# Patient Record
Sex: Male | Born: 1965 | Race: White | Hispanic: No | Marital: Married | Smoking: Former smoker
Health system: Southern US, Community
[De-identification: ages and names within clinical notes are randomized; demographics above are authoritative.]

---

## 1999-03-19 ENCOUNTER — Encounter: Payer: Self-pay | Admitting: Orthopedic Surgery

## 1999-03-19 ENCOUNTER — Observation Stay (HOSPITAL_COMMUNITY): Admission: RE | Admit: 1999-03-19 | Discharge: 1999-03-20 | Payer: Self-pay | Admitting: Orthopedic Surgery

## 2009-02-25 ENCOUNTER — Emergency Department (HOSPITAL_COMMUNITY): Admission: EM | Admit: 2009-02-25 | Discharge: 2009-02-25 | Payer: Self-pay | Admitting: Emergency Medicine

## 2009-04-02 ENCOUNTER — Emergency Department (HOSPITAL_COMMUNITY): Admission: EM | Admit: 2009-04-02 | Discharge: 2009-04-02 | Payer: Self-pay | Admitting: Emergency Medicine

## 2009-04-02 ENCOUNTER — Encounter: Payer: Self-pay | Admitting: Internal Medicine

## 2009-04-26 DIAGNOSIS — F411 Generalized anxiety disorder: Secondary | ICD-10-CM | POA: Insufficient documentation

## 2009-04-26 DIAGNOSIS — R002 Palpitations: Secondary | ICD-10-CM | POA: Insufficient documentation

## 2009-04-26 DIAGNOSIS — R079 Chest pain, unspecified: Secondary | ICD-10-CM | POA: Insufficient documentation

## 2009-04-27 ENCOUNTER — Ambulatory Visit: Payer: Self-pay | Admitting: Internal Medicine

## 2009-04-27 DIAGNOSIS — I4949 Other premature depolarization: Secondary | ICD-10-CM | POA: Insufficient documentation

## 2009-05-03 ENCOUNTER — Ambulatory Visit: Payer: Self-pay

## 2009-05-03 ENCOUNTER — Encounter: Payer: Self-pay | Admitting: Internal Medicine

## 2009-05-07 ENCOUNTER — Encounter: Payer: Self-pay | Admitting: Cardiology

## 2009-06-01 ENCOUNTER — Ambulatory Visit: Payer: Self-pay | Admitting: Internal Medicine

## 2009-06-01 DIAGNOSIS — F17200 Nicotine dependence, unspecified, uncomplicated: Secondary | ICD-10-CM | POA: Insufficient documentation

## 2009-10-01 ENCOUNTER — Telehealth (INDEPENDENT_AMBULATORY_CARE_PROVIDER_SITE_OTHER): Payer: Self-pay | Admitting: *Deleted

## 2010-12-30 IMAGING — CR DG CHEST 2V
2 series · 2 of 2 positions shown · non-contrast
Comparison: None.

CLINICAL DATA: Chest pressure.  Some shortness of breath.

CHEST - 2 VIEW

[w chest pa]
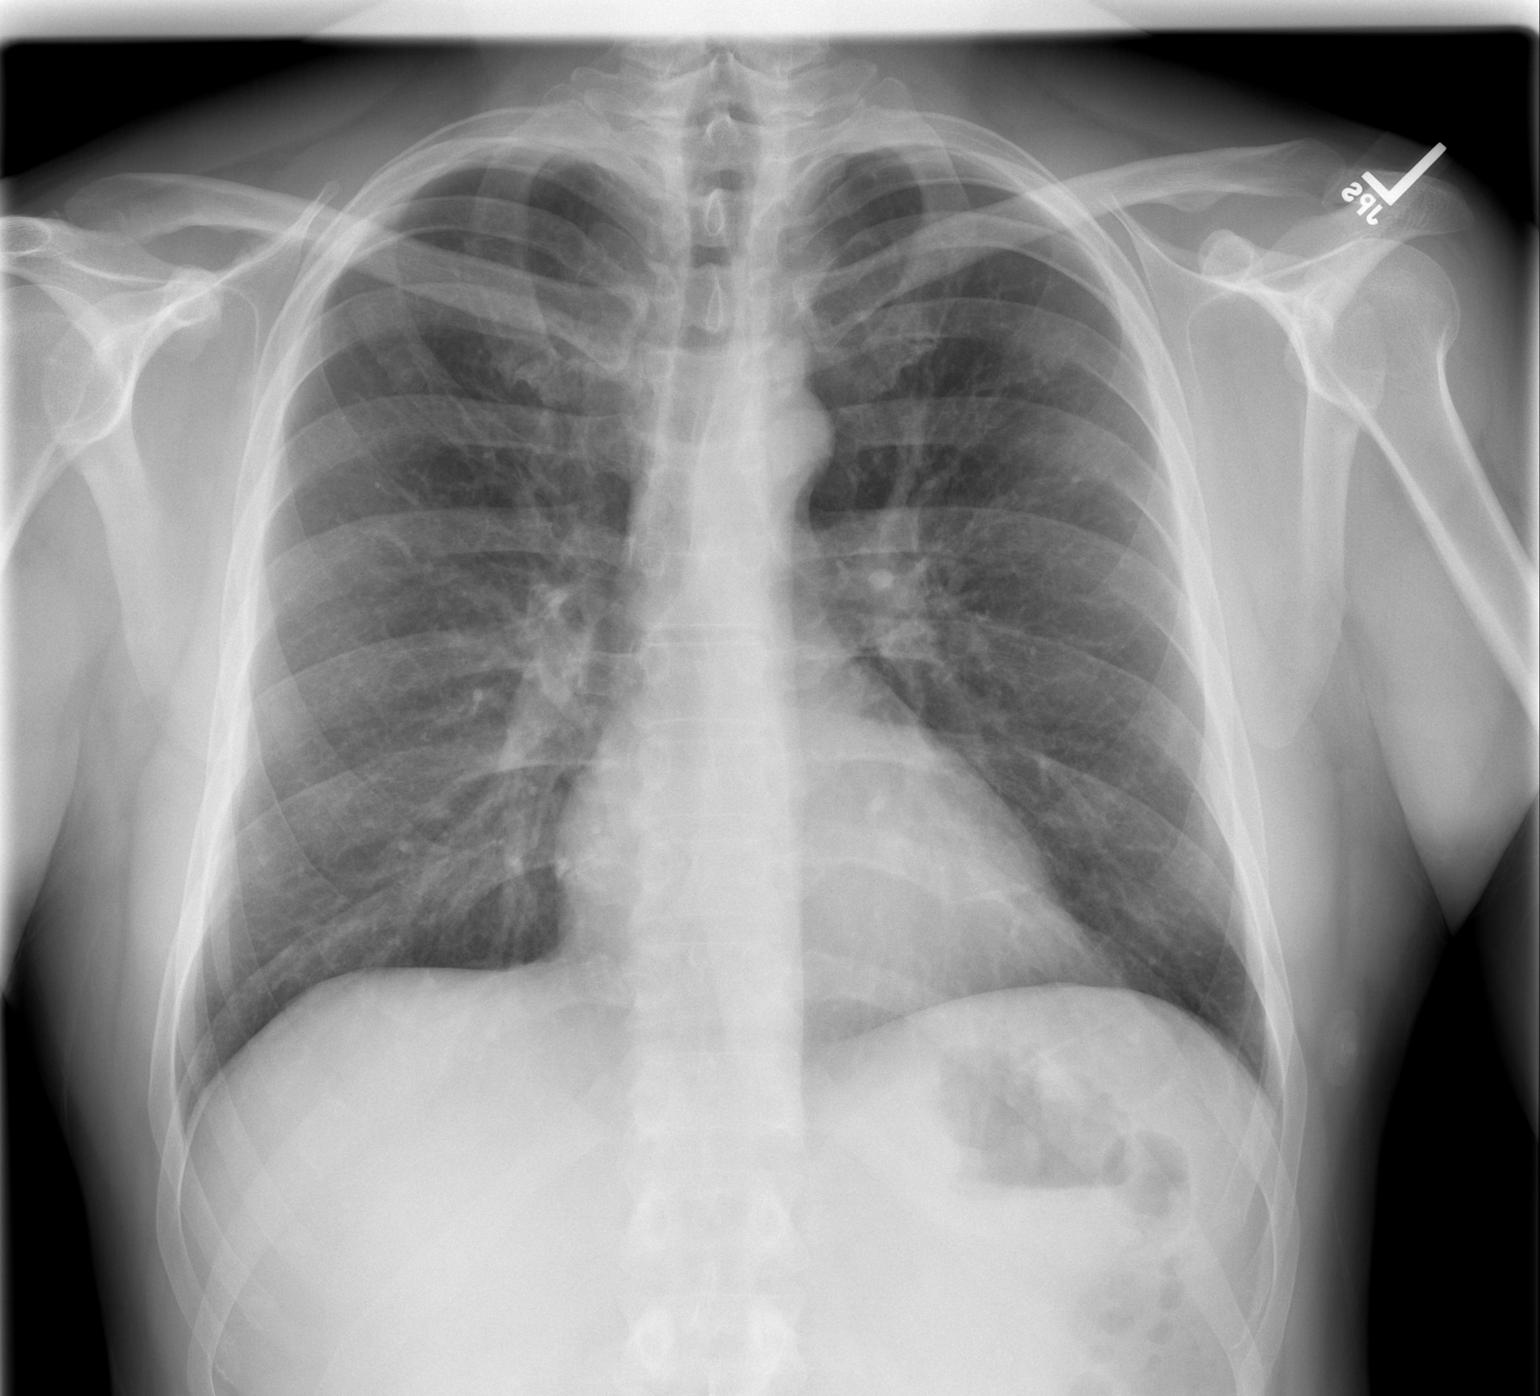

[w chest lat]
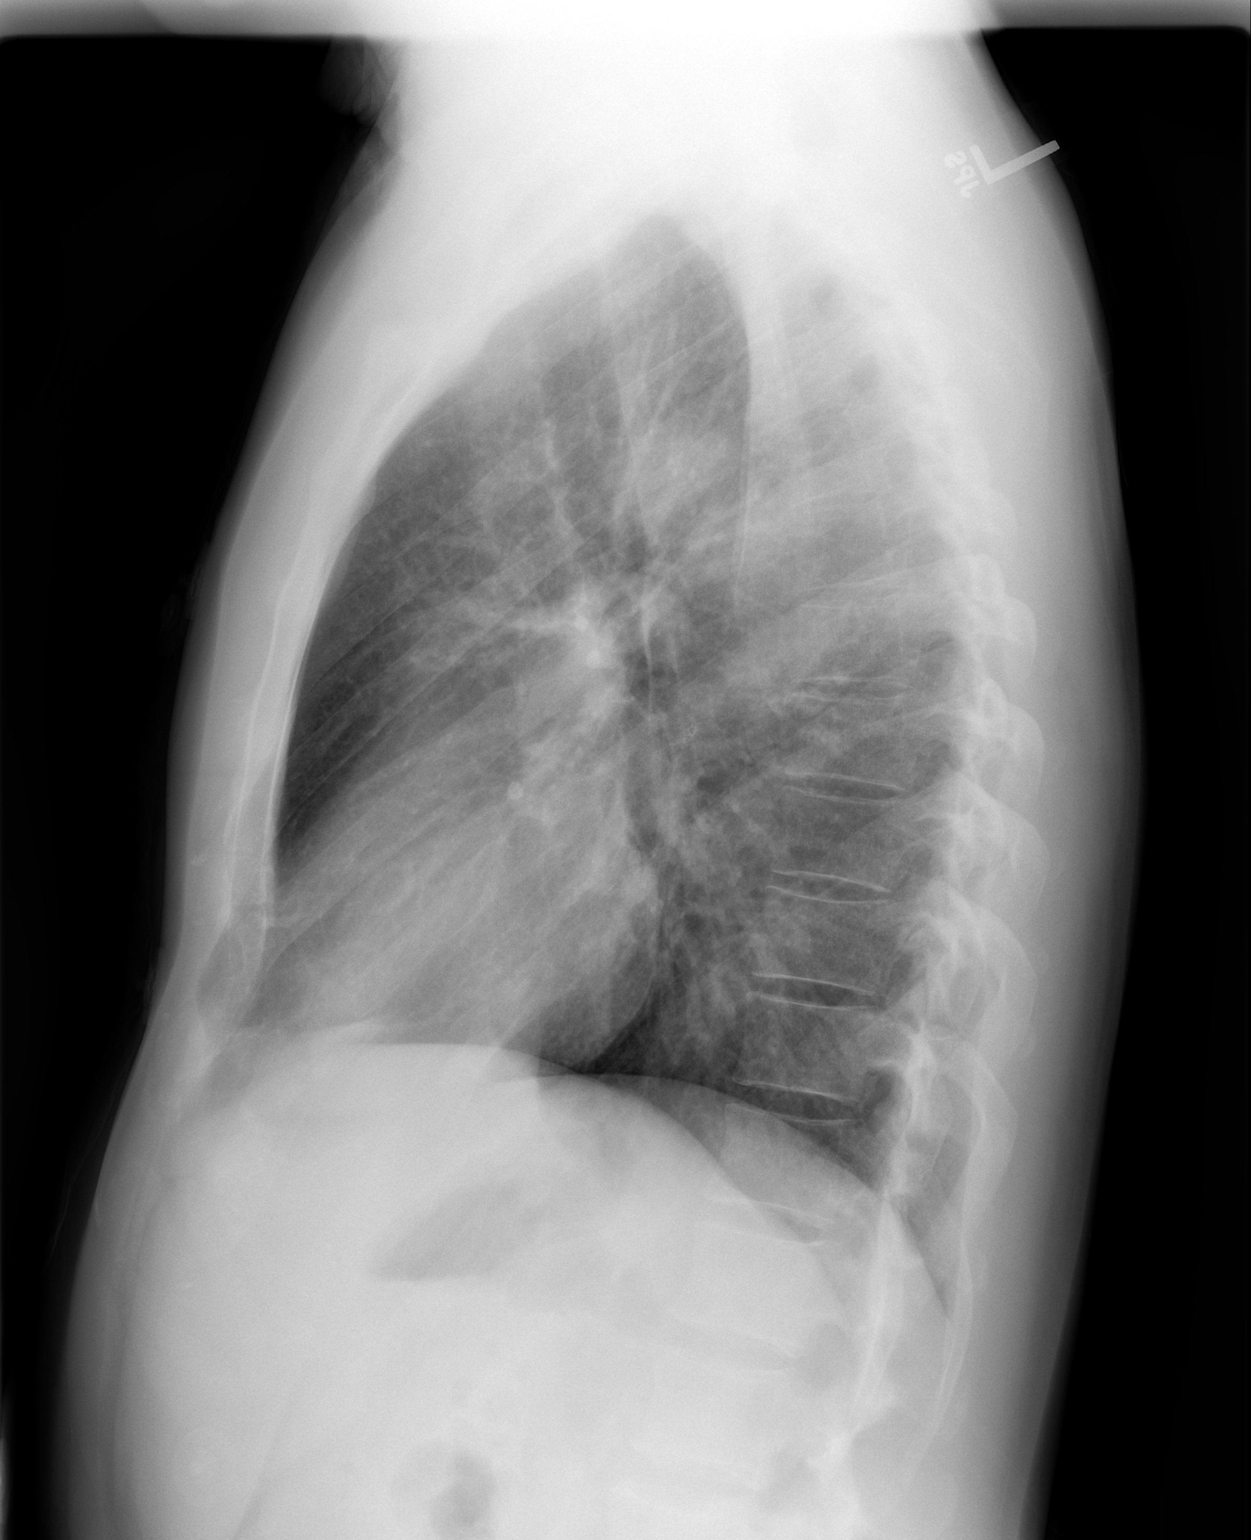

[2 of 2 positions shown; findings below may reference images not displayed]

FINDINGS: The lungs are clear without focal consolidation, edema,
effusion or pneumothorax.  Cardiopericardial silhouette is within
normal limits for size.  Imaged bony structures of the thorax are
intact.
IMPRESSION: No acute cardiopulmonary process.

## 2011-01-20 LAB — POCT I-STAT, CHEM 8
BUN: 14 mg/dL (ref 6–23)
Calcium, Ion: 1.16 mmol/L (ref 1.12–1.32)
Chloride: 106 mEq/L (ref 96–112)
Creatinine, Ser: 1 mg/dL (ref 0.4–1.5)
Glucose, Bld: 96 mg/dL (ref 70–99)
HCT: 48 % (ref 39.0–52.0)
Hemoglobin: 16.3 g/dL (ref 13.0–17.0)
Potassium: 4.3 mEq/L (ref 3.5–5.1)
Sodium: 141 mEq/L (ref 135–145)
TCO2: 28 mmol/L (ref 0–100)

## 2011-01-20 LAB — POCT CARDIAC MARKERS
CKMB, poc: <1 ng/mL — ABNORMAL LOW (ref 1.0–8.0)
Myoglobin, poc: 34.7 ng/mL (ref 12–200)
Troponin i, poc: <0.05 ng/mL (ref 0.00–0.09)

## 2012-05-04 ENCOUNTER — Telehealth: Payer: Self-pay

## 2013-09-01 NOTE — Telephone Encounter (Signed)
error 

## 2021-04-24 ENCOUNTER — Emergency Department (HOSPITAL_BASED_OUTPATIENT_CLINIC_OR_DEPARTMENT_OTHER): Payer: Commercial Managed Care - PPO

## 2021-04-24 ENCOUNTER — Encounter (HOSPITAL_BASED_OUTPATIENT_CLINIC_OR_DEPARTMENT_OTHER): Payer: Self-pay

## 2021-04-24 ENCOUNTER — Other Ambulatory Visit: Payer: Self-pay

## 2021-04-24 ENCOUNTER — Emergency Department (HOSPITAL_BASED_OUTPATIENT_CLINIC_OR_DEPARTMENT_OTHER)
Admission: EM | Admit: 2021-04-24 | Discharge: 2021-04-24 | Disposition: A | Discharge status: Home / Self Care | Payer: Commercial Managed Care - PPO | Attending: Emergency Medicine | Admitting: Emergency Medicine

## 2021-04-24 DIAGNOSIS — R55 Syncope and collapse: Secondary | ICD-10-CM | POA: Diagnosis not present

## 2021-04-24 DIAGNOSIS — R110 Nausea: Secondary | ICD-10-CM | POA: Diagnosis not present

## 2021-04-24 DIAGNOSIS — Z87891 Personal history of nicotine dependence: Secondary | ICD-10-CM | POA: Insufficient documentation

## 2021-04-24 DIAGNOSIS — R000 Tachycardia, unspecified: Secondary | ICD-10-CM | POA: Diagnosis not present

## 2021-04-24 DIAGNOSIS — E860 Dehydration: Secondary | ICD-10-CM | POA: Diagnosis not present

## 2021-04-24 DIAGNOSIS — T675XXA Heat exhaustion, unspecified, initial encounter: Secondary | ICD-10-CM

## 2021-04-24 LAB — COMPREHENSIVE METABOLIC PANEL
ALT: 25 U/L (ref 0–44)
AST: 15 U/L (ref 15–41)
Albumin: 4.7 g/dL (ref 3.5–5.0)
Alkaline Phosphatase: 50 U/L (ref 38–126)
Anion gap: 11 (ref 5–15)
BUN: 19 mg/dL (ref 6–20)
CO2: 22 mmol/L (ref 22–32)
Calcium: 9.9 mg/dL (ref 8.9–10.3)
Chloride: 105 mmol/L (ref 98–111)
Creatinine, Ser: 1.32 mg/dL — ABNORMAL HIGH (ref 0.61–1.24)
GFR, Estimated: >60 mL/min (ref >60)
Glucose, Bld: 106 mg/dL — ABNORMAL HIGH (ref 70–99)
Potassium: 3.6 mmol/L (ref 3.5–5.1)
Sodium: 138 mmol/L (ref 135–145)
Total Bilirubin: 0.8 mg/dL (ref 0.3–1.2)
Total Protein: 8.2 g/dL — ABNORMAL HIGH (ref 6.5–8.1)

## 2021-04-24 LAB — CBC WITH DIFFERENTIAL/PLATELET
Abs Immature Granulocytes: 0.03 10*3/uL (ref 0.00–0.07)
Basophils Absolute: 0 10*3/uL (ref 0.0–0.1)
Basophils Relative: 1 %
Eosinophils Absolute: 0 10*3/uL (ref 0.0–0.5)
Eosinophils Relative: 0 %
HCT: 43.1 % (ref 39.0–52.0)
Hemoglobin: 15 g/dL (ref 13.0–17.0)
Immature Granulocytes: 0 %
Lymphocytes Relative: 19 %
Lymphs Abs: 1.6 10*3/uL (ref 0.7–4.0)
MCH: 33.4 pg (ref 26.0–34.0)
MCHC: 34.8 g/dL (ref 30.0–36.0)
MCV: 96 fL (ref 80.0–100.0)
Monocytes Absolute: 1 10*3/uL (ref 0.1–1.0)
Monocytes Relative: 12 %
Neutro Abs: 5.8 10*3/uL (ref 1.7–7.7)
Neutrophils Relative %: 68 %
Platelets: 251 10*3/uL (ref 150–400)
RBC: 4.49 MIL/uL (ref 4.22–5.81)
RDW: 14.4 % (ref 11.5–15.5)
WBC: 8.5 10*3/uL (ref 4.0–10.5)
nRBC: 0 % (ref 0.0–0.2)

## 2021-04-24 LAB — URINALYSIS, ROUTINE W REFLEX MICROSCOPIC
Bilirubin Urine: NEGATIVE
Glucose, UA: NEGATIVE mg/dL
Hgb urine dipstick: NEGATIVE
Ketones, ur: NEGATIVE mg/dL
Leukocytes,Ua: NEGATIVE
Nitrite: NEGATIVE
Protein, ur: NEGATIVE mg/dL
Specific Gravity, Urine: >1.03 — ABNORMAL HIGH (ref 1.005–1.030)
pH: 5 (ref 5.0–8.0)

## 2021-04-24 MED ORDER — LACTATED RINGERS IV BOLUS
1000.0000 mL | Freq: Once | INTRAVENOUS | Status: AC
  Administered 2021-04-24: 1000 mL via INTRAVENOUS

## 2021-04-24 NOTE — ED Provider Notes (Signed)
[facility] EMERGENCY DEPARTMENT Provider Note   CSN: [id number] Arrival date & time: 04/24/21  1512     History Chief Complaint  Patient presents with   Dizziness    Isaac Harris is a 55 y.o. male.  The history is provided by the patient.  Dizziness Isaac Harris is a 55 y.o. male who presents to the Emergency Department complaining of dizziness. 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 By noon today he felt hot, dizzy with significant diaphoresis and was severely fatigued. He felt like he was going to pass out. He states that he felt like he got overheated working in the community. He had a similar but more mild episode yesterday that occurred at the end of the day. He also reports a headache that started this afternoon. Headaches are very unusual for him. His coworker brought him to the emergency department and he sat and air conditioning for about 30 minutes. He is starting to feel improved on ED arrival but continues to feel unwell. He denies any chest pain. He did have nausea and couldn't eat. He did not eat breakfast this morning. He sometimes skips breakfast. He has no known medical problems and takes no medications.      History reviewed. No pertinent past medical history.  Patient Active Problem List   Diagnosis Date Noted   TOBACCO ABUSE 06/01/2009   UNSPECIFIED PREMATURE BEATS 04/27/2009   ANXIETY 04/26/2009   PALPITATIONS 04/26/2009   CHEST PAIN 04/26/2009    History reviewed. No pertinent surgical history.     No family history on file.  Social History   Tobacco Use   Smoking status: Former    Pack years: 0.00    Types: Cigarettes   Smokeless tobacco: Current  Substance Use Topics   Alcohol use: Yes    Comment: daily   Drug use: Never    Home Medications Prior to Admission medications   Not on File    Allergies    Patient has no known allergies.  Review of Systems   Review of Systems   Neurological:  Positive for dizziness.  All other systems reviewed and are negative.  Physical Exam Updated Vital Signs BP 127/83 (BP Location: Right Arm)   Pulse (!) 107   Temp 98 F (36.7 C) (Oral)   Resp 15   Ht 5\' 7"  (1.702 m)   Wt 83.9 kg   SpO2 98%   BMI 28.98 kg/m   Physical Exam Vitals and nursing note reviewed.  Constitutional:      Appearance: He is well-developed.  HENT:     Head: Normocephalic and atraumatic.  Eyes:     Extraocular Movements: Extraocular movements intact.     Pupils: Pupils are equal, round, and reactive to light.  Cardiovascular:     Rate and Rhythm: Normal rate and regular rhythm.     Heart sounds: No murmur heard. Pulmonary:     Effort: Pulmonary effort is normal. No respiratory distress.     Breath sounds: Normal breath sounds.  Abdominal:     Palpations: Abdomen is soft.     Tenderness: There is no abdominal tenderness. There is no guarding or rebound.  Musculoskeletal:        General: No swelling or tenderness.  Skin:    General: Skin is warm and dry.  Neurological:     Mental Status: He is alert and oriented to person, place, and time.  Comments: No asymmetry of facial movements. 5/5 strength in all four extremities.    Psychiatric:        Behavior: Behavior normal.    ED Results / Procedures / Treatments   Labs (all labs ordered are listed, but only abnormal results are displayed) Labs Reviewed  COMPREHENSIVE METABOLIC PANEL - Abnormal; Notable for the following components:      Result Value   Glucose, Bld 106 (*)    Creatinine, Ser 1.32 (*)    Total Protein 8.2 (*)    All other components within normal limits  URINALYSIS, ROUTINE W REFLEX MICROSCOPIC - Abnormal; Notable for the following components:   Specific Gravity, Urine >1.030 (*)    All other components within normal limits  CBC WITH DIFFERENTIAL/PLATELET    EKG EKG Interpretation  Date/Time:  Wednesday April 24 2021 16:04:39 EDT Ventricular Rate:   101 PR Interval:  162 QRS Duration: 78 QT Interval:  344 QTC Calculation: 446 R Axis:   73 Text Interpretation: Sinus tachycardia Confirmed by Tilden Fossa [phone]) on 04/24/2021 4:10:25 PM  Radiology CT Head Wo Contrast  Result Date: 04/24/2021 CLINICAL DATA:  Headache.  Rule out intracranial hemorrhage EXAM: CT HEAD WITHOUT CONTRAST TECHNIQUE: Contiguous axial images were obtained from the base of the skull through the vertex without intravenous contrast. COMPARISON:  None. FINDINGS: Brain: No evidence of acute infarction, hemorrhage, hydrocephalus, extra-axial collection or mass lesion/mass effect. Vascular: Negative for hyperdense vessel Skull: Negative Sinuses/Orbits: Paranasal sinuses clear.  Negative orbit Other: None IMPRESSION: Negative CT head Electronically Signed   By: Marlan Palau M.D.   On: 04/24/2021 16:18    Procedures Procedures   Medications Ordered in ED Medications  lactated ringers bolus 1,000 mL (1,000 mLs Intravenous New Bag/Given 04/24/21 1604)    ED Course  I have reviewed the triage vital signs and the nursing notes.  Pertinent labs & imaging results that were available during my care of the patient were reviewed by me and considered in my medical decision making (see chart for details).    MDM Rules/Calculators/A&P                         patient here for evaluation of dizziness and near syncopal episode in context of working in the heat today and not eating breakfast. He is tachycardic on ED presentation with no focal neurologic deficits. He does have a new onset headache, no history of similars in the past. Imaging is negative for acute abnormality. No significant electrolyte abnormality on labs. No significant anemia. He was treated with IV fluids in the department and continued to feel improved during his ED stay. Discussed with patient home care for heat illness as well as dehydration. Discussed outpatient follow-up and return precautions.  Final  Clinical Impression(s) / ED Diagnoses Final diagnoses:  Dehydration  Heat exhaustion, initial encounter    Rx / DC Orders ED Discharge Orders     None        Tilden Fossa, MD 04/24/21 1635

## 2021-04-24 NOTE — ED Triage Notes (Signed)
Pt c/o intermittent dizziness started yesterday while working outside-states worse since 12pm today with HA-NAD-to triage in w/c

## 2021-07-17 ENCOUNTER — Other Ambulatory Visit: Payer: Self-pay

## 2021-07-17 ENCOUNTER — Ambulatory Visit (INDEPENDENT_AMBULATORY_CARE_PROVIDER_SITE_OTHER): Payer: Commercial Managed Care - PPO | Admitting: Otolaryngology

## 2021-07-17 DIAGNOSIS — J310 Chronic rhinitis: Secondary | ICD-10-CM

## 2021-07-17 MED ORDER — TRIAMCINOLONE ACETONIDE 55 MCG/ACT NA AERO: 2.0000 | INHALATION_SPRAY | Freq: Every day | NASAL | 12 refills | Status: AC

## 2021-07-17 NOTE — Progress Notes (Addendum)
HPI: Isaac Harris is a 55 y.o. male who presents for evaluation of complaints of pressure in his ears.  He complains of fullness in the ears as if he has an ear infection or fluid in the ears.  He has not noticed that much hearing problems although when he "pops" his ears he feels like he hears better.  He was tried on Flonase by his medical doctor but this did not seem to help that much and he has not used this regularly. He apparently had COVID back in August.  He does work around a lot of loud noise and machinery.  No past medical history on file. No past surgical history on file. Social History   Socioeconomic History   Marital status: Married    Spouse name: Not on file   Number of children: Not on file   Years of education: Not on file   Highest education level: Not on file  Occupational History   Not on file  Tobacco Use   Smoking status: Former    Types: Cigarettes   Smokeless tobacco: Current  Substance and Sexual Activity   Alcohol use: Yes    Comment: daily   Drug use: Never   Sexual activity: Not on file  Other Topics Concern   Not on file  Social History Narrative   Not on file   Social Determinants of Health   Financial Resource Strain: Not on file  Food Insecurity: Not on file  Transportation Needs: Not on file  Physical Activity: Not on file  Stress: Not on file  Social Connections: Not on file   No family history on file. No Known Allergies Prior to Admission medications   Not on File     Positive ROS: Otherwise negative  All other systems have been reviewed and were otherwise negative with the exception of those mentioned in the HPI and as above.  Physical Exam: Constitutional: Alert, well-appearing, no acute distress Ears: External ears without lesions or tenderness. Ear canals are clear bilaterally.  No significant wax buildup no inflammatory changes.  TMs are clear bilaterally with no middle ear space abnormality noted and no middle ear  fluid noted.  Hearing screening with the 512 1024 tuning fork revealed perhaps a mild hearing loss but AC was greater than BC bilaterally. Nasal: External nose without lesions. Septum with minimal deformity and mild rhinitis.  After decongesting the nose both nasal passages were clear as was in the middle meatus..  Oral: Lips and gums without lesions. Tongue and palate mucosa without lesions. Posterior oropharynx clear. Neck: No palpable adenopathy or masses.  No TMJ discomfort noted. Respiratory: Breathing comfortably  Skin: No facial/neck lesions or rash noted.  Audiologic testing in the office today demonstrated essentially normal hearing except for moderate noise notch at 4000-8000 frequency in the left ear only at a minimal notch on the right side.  SRT's were 10 dB bilaterally and he had type A tympanograms bilaterally.  Procedures  Assessment: Mild rhinitis with perhaps some eustachian tube dysfunction but no middle ear effusion noted and normal hearing evaluation on audiogram.  Plan: Suggested using Nasacort 2 sprays each nostril regularly and sent this into his [facility] in [place]. Reassured him of normal hearing evaluation and no middle ear space or ear canal abnormality noted on microscopic exam.  Narda Bonds, MD

## 2021-07-23 ENCOUNTER — Encounter (INDEPENDENT_AMBULATORY_CARE_PROVIDER_SITE_OTHER): Payer: Self-pay

## 2023-05-12 ENCOUNTER — Emergency Department (HOSPITAL_BASED_OUTPATIENT_CLINIC_OR_DEPARTMENT_OTHER): Payer: Commercial Managed Care - PPO

## 2023-05-12 ENCOUNTER — Encounter (HOSPITAL_BASED_OUTPATIENT_CLINIC_OR_DEPARTMENT_OTHER): Payer: Self-pay | Admitting: Emergency Medicine

## 2023-05-12 ENCOUNTER — Other Ambulatory Visit: Payer: Self-pay

## 2023-05-12 ENCOUNTER — Emergency Department (HOSPITAL_BASED_OUTPATIENT_CLINIC_OR_DEPARTMENT_OTHER)
Admission: EM | Admit: 2023-05-12 | Discharge: 2023-05-12 | Disposition: A | Discharge status: Home / Self Care | Payer: Commercial Managed Care - PPO

## 2023-05-12 DIAGNOSIS — N289 Disorder of kidney and ureter, unspecified: Secondary | ICD-10-CM | POA: Insufficient documentation

## 2023-05-12 DIAGNOSIS — J189 Pneumonia, unspecified organism: Secondary | ICD-10-CM

## 2023-05-12 DIAGNOSIS — E860 Dehydration: Secondary | ICD-10-CM | POA: Diagnosis not present

## 2023-05-12 DIAGNOSIS — R000 Tachycardia, unspecified: Secondary | ICD-10-CM | POA: Diagnosis not present

## 2023-05-12 DIAGNOSIS — R42 Dizziness and giddiness: Secondary | ICD-10-CM | POA: Diagnosis present

## 2023-05-12 LAB — CBC WITH DIFFERENTIAL/PLATELET
Abs Immature Granulocytes: 0.03 10*3/uL (ref 0.00–0.07)
Basophils Absolute: 0.1 10*3/uL (ref 0.0–0.1)
Basophils Relative: 1 %
Eosinophils Absolute: 0 10*3/uL (ref 0.0–0.5)
Eosinophils Relative: 0 %
HCT: 42.6 % (ref 39.0–52.0)
Hemoglobin: 14.8 g/dL (ref 13.0–17.0)
Immature Granulocytes: 0 %
Lymphocytes Relative: 20 %
Lymphs Abs: 1.3 10*3/uL (ref 0.7–4.0)
MCH: 33.2 pg (ref 26.0–34.0)
MCHC: 34.7 g/dL (ref 30.0–36.0)
MCV: 95.5 fL (ref 80.0–100.0)
Monocytes Absolute: 0.8 10*3/uL (ref 0.1–1.0)
Monocytes Relative: 11 %
Neutro Abs: 4.6 10*3/uL (ref 1.7–7.7)
Neutrophils Relative %: 68 %
Platelets: 223 10*3/uL (ref 150–400)
RBC: 4.46 MIL/uL (ref 4.22–5.81)
RDW: 14.2 % (ref 11.5–15.5)
WBC: 6.8 10*3/uL (ref 4.0–10.5)
nRBC: 0 % (ref 0.0–0.2)

## 2023-05-12 LAB — D-DIMER, QUANTITATIVE: D-Dimer, Quant: <0.27 ug/mL-FEU (ref 0.00–0.50)

## 2023-05-12 LAB — COMPREHENSIVE METABOLIC PANEL
ALT: 28 U/L (ref 0–44)
AST: 16 U/L (ref 15–41)
Albumin: 4 g/dL (ref 3.5–5.0)
Alkaline Phosphatase: 51 U/L (ref 38–126)
Anion gap: 9 (ref 5–15)
BUN: 14 mg/dL (ref 6–20)
CO2: 24 mmol/L (ref 22–32)
Calcium: 9.3 mg/dL (ref 8.9–10.3)
Chloride: 105 mmol/L (ref 98–111)
Creatinine, Ser: 1.26 mg/dL — ABNORMAL HIGH (ref 0.61–1.24)
GFR, Estimated: >60 mL/min (ref >60)
Glucose, Bld: 114 mg/dL — ABNORMAL HIGH (ref 70–99)
Potassium: 3.7 mmol/L (ref 3.5–5.1)
Sodium: 138 mmol/L (ref 135–145)
Total Bilirubin: 0.6 mg/dL (ref 0.3–1.2)
Total Protein: 7.5 g/dL (ref 6.5–8.1)

## 2023-05-12 LAB — TROPONIN I (HIGH SENSITIVITY)
Troponin I (High Sensitivity): 4 ng/L (ref <18)
Troponin I (High Sensitivity): 6 ng/L (ref <18)

## 2023-05-12 LAB — MAGNESIUM: Magnesium: 1.7 mg/dL (ref 1.7–2.4)

## 2023-05-12 LAB — CBG MONITORING, ED: Glucose-Capillary: 143 mg/dL — ABNORMAL HIGH (ref 70–99)

## 2023-05-12 MED ORDER — LACTATED RINGERS IV BOLUS
2000.0000 mL | Freq: Once | INTRAVENOUS | Status: AC
  Administered 2023-05-12: 2000 mL via INTRAVENOUS

## 2023-05-12 MED ORDER — DOXYCYCLINE HYCLATE 100 MG PO CAPS
100.0000 mg | ORAL_CAPSULE | Freq: Two times a day (BID) | ORAL | 0 refills | Status: AC
Start: 2023-05-12 — End: ?

## 2023-05-12 NOTE — ED Provider Notes (Signed)
Patient's care assumed at 3:30 PM.  Patient is a 57 year old who reports feeling dizzy today.  Patient initially presented tachycardic and complaining of dizziness.  He has had laboratory evaluations chest x-ray and CT of his head.  X-ray shows prominent perihilar opacities. Troponin is negative x 2.  D-dimer negative.  Patient received 2 L of IV fluids he reports feeling much better.  Discussed chest x-ray results with patient's he reports he has had some increased phlegm and some difficulty feeling like he needed to cough up mucus.  Will treat patient with doxycycline.  Patient is advised to drink plenty of fluids.  Stay out of the heat.  He is advised to see his primary care physician for recheck in 2 to 3 days.  I advised him to return to the emergency department if symptoms worsen or change.   Isaac Harris 05/12/23 1907    Lonell Grandchild, MD 05/13/23 1215

## 2023-05-12 NOTE — ED Notes (Signed)
D/c paperwork reviewed with pt, including prescriptions and follow up care.  All questions and/or concerns addressed at time of d/c.  No further needs expressed. . Pt verbalized understanding, Ambulatory with family to ED exit, NAD.   

## 2023-05-12 NOTE — ED Triage Notes (Signed)
Pt  here from work with c/o dizziness and near syncopal episode while at work , did not eat today , cbg 143

## 2023-05-12 NOTE — ED Provider Notes (Signed)
[facility] Provider Note   CSN: [id number] Arrival date & time: 05/12/23  1320     History  Chief Complaint  Patient presents with   Dizziness    Isaac Harris is a 57 y.o. male.  57 year old male with no significant past medical history presents today for dizziness/lightheadedness that is been ongoing since waking up this morning which was around 6 AM.  He works as a Programmer, applications.  States he works for shifts per week.  Today was his second shift.  He states he just has not felt right morning.  States he tries to drink plenty of fluids throughout the day.  He does state that he has a job that is pretty demanding and he is under significant amount of stress from work standpoint.  He states he has been bit more short of breath but that may be because it has been muddy on the job site.  No chest pain.  Has felt his heart racing but no irregular heartbeat.  Denies current headache but states he feels like the headaches may be coming on soon.  No weakness, paresthesias, vision change.  He states he had heat exhaustion about 2 years ago.  This does not feel the same.  The history is provided by the patient. No language interpreter was used.       Home Medications Prior to Admission medications   Medication Sig Start Date End Date Taking? Authorizing Provider  triamcinolone (NASACORT) 55 MCG/ACT AERO nasal inhaler Place 2 sprays into the nose daily. 2 sprays each nostril at night as needed nasal congestion or ear pressure 07/17/21   Drema Halon, MD      Allergies    Patient has no known allergies.    Review of Systems   Review of Systems  Constitutional:  Negative for fever.  Respiratory:  Positive for shortness of breath.   Cardiovascular:  Negative for chest pain and palpitations.  Gastrointestinal:  Negative for nausea and vomiting.  Neurological:  Negative for light-headedness.  All other systems reviewed and  are negative.   Physical Exam Updated Vital Signs BP 118/81 (BP Location: Right Arm)   Pulse (!) 115   Temp 97.8 F (36.6 C)   Resp 20   Ht 5\' 7"  (1.702 m)   Wt 83.9 kg   SpO2 96%   BMI 28.98 kg/m  Physical Exam Vitals and nursing note reviewed.  Constitutional:      General: He is not in acute distress.    Appearance: Normal appearance. He is not ill-appearing.  HENT:     Head: Normocephalic and atraumatic.     Nose: Nose normal.  Eyes:     General: No scleral icterus.    Extraocular Movements: Extraocular movements intact.     Conjunctiva/sclera: Conjunctivae normal.  Cardiovascular:     Rate and Rhythm: Regular rhythm. Tachycardia present.     Heart sounds: Normal heart sounds.  Pulmonary:     Effort: Pulmonary effort is normal. No respiratory distress.     Breath sounds: Normal breath sounds. No wheezing or rales.  Abdominal:     General: There is no distension.     Tenderness: There is no abdominal tenderness.  Musculoskeletal:        General: Normal range of motion.     Cervical back: Normal range of motion.     Right lower leg: No edema.     Left lower leg: No edema.  Skin:  General: Skin is warm and dry.  Neurological:     General: No focal deficit present.     Mental Status: He is alert. Mental status is at baseline.     ED Results / Procedures / Treatments   Labs (all labs ordered are listed, but only abnormal results are displayed) Labs Reviewed  CBG MONITORING, ED - Abnormal; Notable for the following components:      Result Value   Glucose-Capillary 143 (*)    All other components within normal limits  CBC WITH DIFFERENTIAL/PLATELET  COMPREHENSIVE METABOLIC PANEL  MAGNESIUM  TROPONIN I (HIGH SENSITIVITY)    EKG None  Radiology No results found.  Procedures Procedures    Medications Ordered in ED Medications  lactated ringers bolus 2,000 mL (2,000 mLs Intravenous New Bag/Given 05/12/23 1406)    ED Course/ Medical Decision  Making/ A&P                                 Medical Decision Making Amount and/or Complexity of Data Reviewed Labs: ordered. Radiology: ordered.     Medical Decision Making / ED Course   This patient presents to the ED for concern of lightheadedness, this involves an extensive number of treatment options, and is a complaint that carries with it a high risk of complications and morbidity.  The differential diagnosis includes heat exhaustion, dehydration, orthostasis, CVA, ACS  MDM: 57 year old male with no significant past medical history presents today for concern of lightheadedness that has been ongoing since waking up this morning.  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  No anginal symptoms but does endorse more shortness of breath but relates this to body worksite.  CT head without acute intracranial finding.  Chest x-ray shows some vascular congestion otherwise no acute cardiopulmonary process.  CBC is unremarkable.  CMP shows mild renal insufficiency otherwise no acute finding.  Magnesium 1.7.  Initial troponin of 4.  EKG without acute ischemic changes.  Low heart score.  Low suspicion for ACS.  Patient is awaiting fluid completion and at the end of my shift as well as reevaluation.  Signed out to oncoming PA for reeval and disposition.   Lab Tests: -I ordered, reviewed, and interpreted labs.   The pertinent results include:   Labs Reviewed  COMPREHENSIVE METABOLIC PANEL - Abnormal; Notable for the following components:      Result Value   Glucose, Bld 114 (*)    Creatinine, Ser 1.26 (*)    All other components within normal limits  CBG MONITORING, ED - Abnormal; Notable for the following components:   Glucose-Capillary 143 (*)    All other components within normal limits  CBC WITH DIFFERENTIAL/PLATELET  MAGNESIUM  TROPONIN I (HIGH SENSITIVITY)      EKG  EKG Interpretation Date/Time:    Ventricular Rate:    PR Interval:    QRS Duration:    QT  Interval:    QTC Calculation:   R Axis:      Text Interpretation:           Imaging Studies ordered: I ordered imaging studies including CT head, chest x-ray I independently visualized and interpreted imaging. I agree with the radiologist interpretation   Medicines ordered and prescription drug management: Meds ordered this encounter  Medications   lactated ringers bolus 2,000 mL    -I have reviewed the patients home medicines and have made adjustments as  needed   Cardiac Monitoring: The patient was maintained on a cardiac monitor.  I personally viewed and interpreted the cardiac monitored which showed an underlying rhythm of: Sinus tachycardia   Reevaluation: After the interventions noted above, I reevaluated the patient and found that they have :improved  Co morbidities that complicate the patient evaluation History reviewed. No pertinent past medical history.    Dispostion: Patient signed out to oncoming provider for reevaluation after fluid completion.   Final Clinical Impression(s) / ED Diagnoses Final diagnoses:  Dehydration  Lightheadedness    Rx / DC Orders ED Discharge Orders     None         Marita Kansas, PA-C 05/12/23 1511    Coral Spikes, DO 05/13/23 [phone]

## 2023-05-12 NOTE — Discharge Instructions (Addendum)
Return if any problems.

## 2023-05-12 NOTE — ED Notes (Signed)
Pt transported to imaging.
